# Patient Record
Sex: Male | Born: 1945 | Race: White | Hispanic: No | Marital: Married | Smoking: Former smoker
Health system: Southern US, Community
[De-identification: ages and names within clinical notes are randomized; demographics above are authoritative.]

## PROBLEM LIST (undated history)

## (undated) DIAGNOSIS — I214 Non-ST elevation (NSTEMI) myocardial infarction: Secondary | ICD-10-CM

## (undated) DIAGNOSIS — I2510 Atherosclerotic heart disease of native coronary artery without angina pectoris: Secondary | ICD-10-CM

## (undated) DIAGNOSIS — E119 Type 2 diabetes mellitus without complications: Secondary | ICD-10-CM

## (undated) DIAGNOSIS — I10 Essential (primary) hypertension: Secondary | ICD-10-CM

## (undated) DIAGNOSIS — I442 Atrioventricular block, complete: Secondary | ICD-10-CM

---

## 2020-12-20 ENCOUNTER — Other Ambulatory Visit (HOSPITAL_COMMUNITY): Payer: Self-pay

## 2020-12-20 ENCOUNTER — Inpatient Hospital Stay (HOSPITAL_COMMUNITY): Payer: No Typology Code available for payment source

## 2020-12-20 ENCOUNTER — Emergency Department (HOSPITAL_COMMUNITY): Payer: No Typology Code available for payment source

## 2020-12-20 ENCOUNTER — Encounter (HOSPITAL_COMMUNITY): Payer: Self-pay | Admitting: *Deleted

## 2020-12-20 ENCOUNTER — Observation Stay (HOSPITAL_COMMUNITY)
Admission: EM | Admit: 2020-12-20 | Discharge: 2020-12-21 | Disposition: A | Discharge status: Home / Self Care | Payer: No Typology Code available for payment source | Attending: Neurosurgery | Admitting: Neurosurgery

## 2020-12-20 ENCOUNTER — Other Ambulatory Visit: Payer: Self-pay

## 2020-12-20 DIAGNOSIS — Z87891 Personal history of nicotine dependence: Secondary | ICD-10-CM | POA: Insufficient documentation

## 2020-12-20 DIAGNOSIS — Z7901 Long term (current) use of anticoagulants: Secondary | ICD-10-CM | POA: Diagnosis not present

## 2020-12-20 DIAGNOSIS — S065X9A Traumatic subdural hemorrhage with loss of consciousness of unspecified duration, initial encounter: Secondary | ICD-10-CM | POA: Diagnosis not present

## 2020-12-20 DIAGNOSIS — Y92410 Unspecified street and highway as the place of occurrence of the external cause: Secondary | ICD-10-CM | POA: Diagnosis not present

## 2020-12-20 DIAGNOSIS — S065XAA Traumatic subdural hemorrhage with loss of consciousness status unknown, initial encounter: Secondary | ICD-10-CM | POA: Diagnosis present

## 2020-12-20 DIAGNOSIS — I10 Essential (primary) hypertension: Secondary | ICD-10-CM | POA: Insufficient documentation

## 2020-12-20 DIAGNOSIS — K915 Postcholecystectomy syndrome: Secondary | ICD-10-CM | POA: Diagnosis not present

## 2020-12-20 DIAGNOSIS — Y900 Blood alcohol level of less than 20 mg/100 ml: Secondary | ICD-10-CM | POA: Insufficient documentation

## 2020-12-20 DIAGNOSIS — Z20822 Contact with and (suspected) exposure to covid-19: Secondary | ICD-10-CM | POA: Insufficient documentation

## 2020-12-20 DIAGNOSIS — Z23 Encounter for immunization: Secondary | ICD-10-CM | POA: Insufficient documentation

## 2020-12-20 DIAGNOSIS — S0101XA Laceration without foreign body of scalp, initial encounter: Secondary | ICD-10-CM | POA: Insufficient documentation

## 2020-12-20 DIAGNOSIS — I2510 Atherosclerotic heart disease of native coronary artery without angina pectoris: Secondary | ICD-10-CM | POA: Diagnosis not present

## 2020-12-20 DIAGNOSIS — I214 Non-ST elevation (NSTEMI) myocardial infarction: Secondary | ICD-10-CM | POA: Insufficient documentation

## 2020-12-20 DIAGNOSIS — E119 Type 2 diabetes mellitus without complications: Secondary | ICD-10-CM | POA: Diagnosis not present

## 2020-12-20 DIAGNOSIS — S40812A Abrasion of left upper arm, initial encounter: Secondary | ICD-10-CM | POA: Diagnosis not present

## 2020-12-20 DIAGNOSIS — S0990XA Unspecified injury of head, initial encounter: Secondary | ICD-10-CM | POA: Diagnosis present

## 2020-12-20 DIAGNOSIS — S41112A Laceration without foreign body of left upper arm, initial encounter: Secondary | ICD-10-CM | POA: Insufficient documentation

## 2020-12-20 DIAGNOSIS — T1490XA Injury, unspecified, initial encounter: Secondary | ICD-10-CM

## 2020-12-20 HISTORY — DX: Non-ST elevation (NSTEMI) myocardial infarction: I21.4

## 2020-12-20 HISTORY — DX: Atherosclerotic heart disease of native coronary artery without angina pectoris: I25.10

## 2020-12-20 HISTORY — DX: Essential (primary) hypertension: I10

## 2020-12-20 HISTORY — DX: Type 2 diabetes mellitus without complications: E11.9

## 2020-12-20 HISTORY — DX: Atrioventricular block, complete: I44.2

## 2020-12-20 LAB — I-STAT CHEM 8, ED
BUN: 25 mg/dL — ABNORMAL HIGH (ref 8–23)
Calcium, Ion: 1.19 mmol/L (ref 1.15–1.40)
Chloride: 103 mmol/L (ref 98–111)
Creatinine, Ser: 1.2 mg/dL (ref 0.61–1.24)
Glucose, Bld: 122 mg/dL — ABNORMAL HIGH (ref 70–99)
HCT: 45 % (ref 39.0–52.0)
Hemoglobin: 15.3 g/dL (ref 13.0–17.0)
Potassium: 3.8 mmol/L (ref 3.5–5.1)
Sodium: 140 mmol/L (ref 135–145)
TCO2: 27 mmol/L (ref 22–32)

## 2020-12-20 LAB — CBC
HCT: 46.2 % (ref 39.0–52.0)
Hemoglobin: 15.5 g/dL (ref 13.0–17.0)
MCH: 32 pg (ref 26.0–34.0)
MCHC: 33.5 g/dL (ref 30.0–36.0)
MCV: 95.5 fL (ref 80.0–100.0)
Platelets: 144 10*3/uL — ABNORMAL LOW (ref 150–400)
RBC: 4.84 MIL/uL (ref 4.22–5.81)
RDW: 13.1 % (ref 11.5–15.5)
WBC: 9.8 10*3/uL (ref 4.0–10.5)
nRBC: 0 % (ref 0.0–0.2)

## 2020-12-20 LAB — COMPREHENSIVE METABOLIC PANEL
ALT: 35 U/L (ref 0–44)
AST: 42 U/L — ABNORMAL HIGH (ref 15–41)
Albumin: 3.7 g/dL (ref 3.5–5.0)
Alkaline Phosphatase: 78 U/L (ref 38–126)
Anion gap: 5 (ref 5–15)
BUN: 21 mg/dL (ref 8–23)
CO2: 27 mmol/L (ref 22–32)
Calcium: 9.2 mg/dL (ref 8.9–10.3)
Chloride: 104 mmol/L (ref 98–111)
Creatinine, Ser: 1.33 mg/dL — ABNORMAL HIGH (ref 0.61–1.24)
GFR, Estimated: 56 mL/min — ABNORMAL LOW (ref >60)
Glucose, Bld: 124 mg/dL — ABNORMAL HIGH (ref 70–99)
Potassium: 3.7 mmol/L (ref 3.5–5.1)
Sodium: 136 mmol/L (ref 135–145)
Total Bilirubin: 0.9 mg/dL (ref 0.3–1.2)
Total Protein: 6.9 g/dL (ref 6.5–8.1)

## 2020-12-20 LAB — ETHANOL: Alcohol, Ethyl (B): <10 mg/dL (ref <10)

## 2020-12-20 LAB — RESP PANEL BY RT-PCR (FLU A&B, COVID) ARPGX2
Influenza A by PCR: NEGATIVE
Influenza B by PCR: NEGATIVE
SARS Coronavirus 2 by RT PCR: NEGATIVE

## 2020-12-20 LAB — URINALYSIS, ROUTINE W REFLEX MICROSCOPIC
Bilirubin Urine: NEGATIVE
Glucose, UA: NEGATIVE mg/dL
Hgb urine dipstick: NEGATIVE
Ketones, ur: NEGATIVE mg/dL
Leukocytes,Ua: NEGATIVE
Nitrite: NEGATIVE
Protein, ur: NEGATIVE mg/dL
Specific Gravity, Urine: 1.044 — ABNORMAL HIGH (ref 1.005–1.030)
pH: 5 (ref 5.0–8.0)

## 2020-12-20 LAB — PROTIME-INR
INR: 1.1 (ref 0.8–1.2)
Prothrombin Time: 14.6 seconds (ref 11.4–15.2)

## 2020-12-20 LAB — SAMPLE TO BLOOD BANK

## 2020-12-20 LAB — LACTIC ACID, PLASMA: Lactic Acid, Venous: 1.5 mmol/L (ref 0.5–1.9)

## 2020-12-20 MED ORDER — IOHEXOL 350 MG/ML SOLN
100.0000 mL | Freq: Once | INTRAVENOUS | Status: AC | PRN
  Administered 2020-12-20: 100 mL via INTRAVENOUS

## 2020-12-20 MED ORDER — POLYETHYLENE GLYCOL 3350 17 G PO PACK: 17.0000 g | PACK | Freq: Every day | ORAL | Status: DC | PRN

## 2020-12-20 MED ORDER — IOHEXOL 350 MG/ML SOLN
75.0000 mL | Freq: Once | INTRAVENOUS | Status: AC | PRN
  Administered 2020-12-20: 75 mL via INTRAVENOUS

## 2020-12-20 MED ORDER — SODIUM CHLORIDE 0.9 % IV SOLN
1000.0000 mL | INTRAVENOUS | Status: DC
  Administered 2020-12-20: 1000 mL via INTRAVENOUS

## 2020-12-20 MED ORDER — FLEET ENEMA 7-19 GM/118ML RE ENEM: 1.0000 | ENEMA | Freq: Once | RECTAL | Status: DC | PRN

## 2020-12-20 MED ORDER — ONDANSETRON HCL 4 MG PO TABS
4.0000 mg | ORAL_TABLET | Freq: Four times a day (QID) | ORAL | Status: DC | PRN
Start: 2020-12-20 — End: 2020-12-21

## 2020-12-20 MED ORDER — BISACODYL 10 MG RE SUPP: 10.0000 mg | Freq: Every day | RECTAL | Status: DC | PRN

## 2020-12-20 MED ORDER — ACETAMINOPHEN 325 MG PO TABS: 650.0000 mg | ORAL_TABLET | Freq: Four times a day (QID) | ORAL | Status: DC | PRN

## 2020-12-20 MED ORDER — HYDROCODONE-ACETAMINOPHEN 5-325 MG PO TABS
1.0000 | ORAL_TABLET | ORAL | Status: DC | PRN
  Administered 2020-12-20 – 2020-12-21 (×2): 2 via ORAL
  Filled 2020-12-20 (×2): qty 2

## 2020-12-20 MED ORDER — TETANUS-DIPHTH-ACELL PERTUSSIS 5-2.5-18.5 LF-MCG/0.5 IM SUSY
0.5000 mL | PREFILLED_SYRINGE | Freq: Once | INTRAMUSCULAR | Status: AC
  Administered 2020-12-20: 0.5 mL via INTRAMUSCULAR
  Filled 2020-12-20: qty 0.5

## 2020-12-20 MED ORDER — SODIUM CHLORIDE 0.9 % IV BOLUS (SEPSIS)
1000.0000 mL | Freq: Once | INTRAVENOUS | Status: DC
  Administered 2020-12-20: 1000 mL via INTRAVENOUS

## 2020-12-20 MED ORDER — DOCUSATE SODIUM 100 MG PO CAPS
100.0000 mg | ORAL_CAPSULE | Freq: Two times a day (BID) | ORAL | Status: DC
  Administered 2020-12-20 – 2020-12-21 (×2): 100 mg via ORAL
  Filled 2020-12-20 (×2): qty 1

## 2020-12-20 MED ORDER — HYDROMORPHONE HCL 1 MG/ML IJ SOLN
1.0000 mg | Freq: Once | INTRAMUSCULAR | Status: AC
  Administered 2020-12-20: 1 mg via INTRAVENOUS
  Filled 2020-12-20: qty 1

## 2020-12-20 MED ORDER — ONDANSETRON HCL 4 MG/2ML IJ SOLN: 4.0000 mg | Freq: Four times a day (QID) | INTRAMUSCULAR | Status: DC | PRN

## 2020-12-20 MED ORDER — EMPTY CONTAINERS FLEXIBLE MISC
900.0000 mg | Freq: Once | Status: AC
  Administered 2020-12-20: 900 mg via INTRAVENOUS
  Filled 2020-12-20: qty 90

## 2020-12-20 MED ORDER — FENTANYL CITRATE (PF) 100 MCG/2ML IJ SOLN
12.5000 ug | INTRAMUSCULAR | Status: DC | PRN
  Administered 2020-12-20: 50 ug via INTRAVENOUS
  Filled 2020-12-20: qty 2

## 2020-12-20 MED ORDER — ACETAMINOPHEN 650 MG RE SUPP: 650.0000 mg | Freq: Four times a day (QID) | RECTAL | Status: DC | PRN

## 2020-12-20 MED ORDER — FENTANYL CITRATE PF 50 MCG/ML IJ SOSY: 12.5000 ug | PREFILLED_SYRINGE | INTRAMUSCULAR | Status: DC | PRN
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
## 2020-12-20 NOTE — ED Notes (Addendum)
Left hand and scalp NS gauze removed. Swelling noted to the posterior left hand and a small scalp laceration noted. Dr. Eudelia Bunch notified.   Pt ring removed from left hand, placed it in a pt belonging cup and placed 2 pt labels on the belonging cup
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
## 2020-12-21 MED ORDER — HYDROCODONE-ACETAMINOPHEN 5-325 MG PO TABS: 1.0000 | ORAL_TABLET | ORAL | 0 refills | Status: AC | PRN
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
## 2020-12-21 NOTE — Discharge Instructions (Signed)
Light activity.  5 pound lifting maximum.  No driving.  Needs to follow-up with neurosurgeon in [place] area.  Contact Worker's Comp. for referral.  Patient unable to work until cleared
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
